# Patient Record
Sex: Female | Born: 1950 | Race: Black or African American | Hispanic: No | Marital: Married | Smoking: Never smoker
Health system: Southern US, Community
[De-identification: ages and names within clinical notes are randomized; demographics above are authoritative.]

## PROBLEM LIST (undated history)

## (undated) DIAGNOSIS — D649 Anemia, unspecified: Secondary | ICD-10-CM

## (undated) DIAGNOSIS — F419 Anxiety disorder, unspecified: Secondary | ICD-10-CM

## (undated) HISTORY — DX: Anxiety disorder, unspecified: F41.9

## (undated) HISTORY — PX: TONSILLECTOMY: SUR1361

## (undated) HISTORY — PX: TOTAL ABDOMINAL HYSTERECTOMY W/ BILATERAL SALPINGOOPHORECTOMY: SHX83

## (undated) HISTORY — DX: Anemia, unspecified: D64.9

---

## 1997-07-25 ENCOUNTER — Ambulatory Visit (HOSPITAL_COMMUNITY): Admission: RE | Admit: 1997-07-25 | Discharge: 1997-07-25 | Payer: Self-pay | Admitting: Obstetrics and Gynecology

## 1998-01-16 ENCOUNTER — Other Ambulatory Visit: Admission: RE | Admit: 1998-01-16 | Discharge: 1998-01-16 | Payer: Self-pay | Admitting: Obstetrics and Gynecology

## 1998-02-25 ENCOUNTER — Ambulatory Visit (HOSPITAL_COMMUNITY): Admission: RE | Admit: 1998-02-25 | Discharge: 1998-02-25 | Payer: Self-pay | Admitting: Obstetrics and Gynecology

## 1999-03-04 ENCOUNTER — Encounter: Payer: Self-pay | Admitting: Obstetrics and Gynecology

## 1999-03-04 ENCOUNTER — Ambulatory Visit (HOSPITAL_COMMUNITY): Admission: RE | Admit: 1999-03-04 | Discharge: 1999-03-04 | Payer: Self-pay | Admitting: Obstetrics and Gynecology

## 1999-05-12 ENCOUNTER — Other Ambulatory Visit: Admission: RE | Admit: 1999-05-12 | Discharge: 1999-05-12 | Payer: Self-pay | Admitting: *Deleted

## 1999-05-22 ENCOUNTER — Encounter: Payer: Self-pay | Admitting: Obstetrics and Gynecology

## 1999-05-22 ENCOUNTER — Encounter: Admission: RE | Admit: 1999-05-22 | Discharge: 1999-05-22 | Payer: Self-pay | Admitting: *Deleted

## 2000-04-19 ENCOUNTER — Encounter: Payer: Self-pay | Admitting: Obstetrics and Gynecology

## 2000-04-19 ENCOUNTER — Ambulatory Visit (HOSPITAL_COMMUNITY): Admission: RE | Admit: 2000-04-19 | Discharge: 2000-04-19 | Payer: Self-pay | Admitting: Obstetrics and Gynecology

## 2000-06-21 ENCOUNTER — Other Ambulatory Visit: Admission: RE | Admit: 2000-06-21 | Discharge: 2000-06-21 | Payer: Self-pay | Admitting: *Deleted

## 2001-06-23 ENCOUNTER — Encounter: Payer: Self-pay | Admitting: Obstetrics and Gynecology

## 2001-06-23 ENCOUNTER — Ambulatory Visit (HOSPITAL_COMMUNITY): Admission: RE | Admit: 2001-06-23 | Discharge: 2001-06-23 | Payer: Self-pay | Admitting: Obstetrics and Gynecology

## 2001-06-23 ENCOUNTER — Other Ambulatory Visit: Admission: RE | Admit: 2001-06-23 | Discharge: 2001-06-23 | Payer: Self-pay | Admitting: Obstetrics and Gynecology

## 2002-07-06 ENCOUNTER — Other Ambulatory Visit: Admission: RE | Admit: 2002-07-06 | Discharge: 2002-07-06 | Payer: Self-pay | Admitting: Obstetrics and Gynecology

## 2002-10-30 ENCOUNTER — Encounter: Payer: Self-pay | Admitting: Obstetrics and Gynecology

## 2002-10-30 ENCOUNTER — Ambulatory Visit (HOSPITAL_COMMUNITY): Admission: RE | Admit: 2002-10-30 | Discharge: 2002-10-30 | Payer: Self-pay | Admitting: Obstetrics and Gynecology

## 2005-10-06 ENCOUNTER — Encounter: Admission: RE | Admit: 2005-10-06 | Discharge: 2005-10-06 | Payer: Self-pay | Admitting: Obstetrics and Gynecology

## 2008-05-30 ENCOUNTER — Ambulatory Visit (HOSPITAL_COMMUNITY): Admission: RE | Admit: 2008-05-30 | Discharge: 2008-05-30 | Payer: Self-pay | Admitting: Obstetrics and Gynecology

## 2009-06-19 ENCOUNTER — Ambulatory Visit (HOSPITAL_COMMUNITY): Admission: RE | Admit: 2009-06-19 | Discharge: 2009-06-19 | Payer: Self-pay | Admitting: Obstetrics and Gynecology

## 2009-12-24 ENCOUNTER — Encounter: Admission: RE | Admit: 2009-12-24 | Discharge: 2009-12-24 | Payer: Self-pay | Admitting: Obstetrics and Gynecology

## 2010-03-13 ENCOUNTER — Encounter: Admission: RE | Admit: 2010-03-13 | Discharge: 2010-03-13 | Payer: Self-pay | Admitting: Sports Medicine

## 2010-03-30 ENCOUNTER — Encounter: Admission: RE | Admit: 2010-03-30 | Discharge: 2010-03-30 | Payer: Self-pay | Admitting: Sports Medicine

## 2010-04-23 ENCOUNTER — Ambulatory Visit (HOSPITAL_COMMUNITY)
Admission: RE | Admit: 2010-04-23 | Discharge: 2010-04-23 | Payer: Self-pay | Source: Home / Self Care | Attending: Neurosurgery | Admitting: Neurosurgery

## 2010-07-28 LAB — URINALYSIS, ROUTINE W REFLEX MICROSCOPIC
Glucose, UA: NEGATIVE mg/dL
Hgb urine dipstick: NEGATIVE
Protein, ur: NEGATIVE mg/dL
Specific Gravity, Urine: 1.023 (ref 1.005–1.030)
pH: 6 (ref 5.0–8.0)

## 2010-07-28 LAB — URINE MICROSCOPIC-ADD ON

## 2010-07-28 LAB — CBC
HCT: 37.4 % (ref 36.0–46.0)
MCH: 29.6 pg (ref 26.0–34.0)
MCHC: 33.4 g/dL (ref 30.0–36.0)
RBC: 4.22 MIL/uL (ref 3.87–5.11)
RDW: 14.3 % (ref 11.5–15.5)
WBC: 5.2 10*3/uL (ref 4.0–10.5)

## 2010-07-28 LAB — BASIC METABOLIC PANEL
Creatinine, Ser: 0.69 mg/dL (ref 0.4–1.2)
GFR calc Af Amer: >60 mL/min (ref >60)
Sodium: 140 mEq/L (ref 135–145)

## 2011-01-26 ENCOUNTER — Other Ambulatory Visit (HOSPITAL_COMMUNITY): Payer: Self-pay | Admitting: Obstetrics and Gynecology

## 2011-01-26 DIAGNOSIS — Z1231 Encounter for screening mammogram for malignant neoplasm of breast: Secondary | ICD-10-CM

## 2011-01-28 ENCOUNTER — Ambulatory Visit (HOSPITAL_COMMUNITY)
Admission: RE | Admit: 2011-01-28 | Discharge: 2011-01-28 | Disposition: A | Discharge status: Home / Self Care | Payer: Self-pay | Source: Ambulatory Visit | Attending: Obstetrics and Gynecology | Admitting: Obstetrics and Gynecology

## 2011-01-28 DIAGNOSIS — Z1231 Encounter for screening mammogram for malignant neoplasm of breast: Secondary | ICD-10-CM

## 2012-01-12 IMAGING — CR DG CHEST 2V
2 series · 2 of 2 positions shown · non-contrast
Comparison: None

CLINICAL DATA: Preop lumbar HNP

CHEST - 2 VIEW

[view not recorded (1 of 2)]
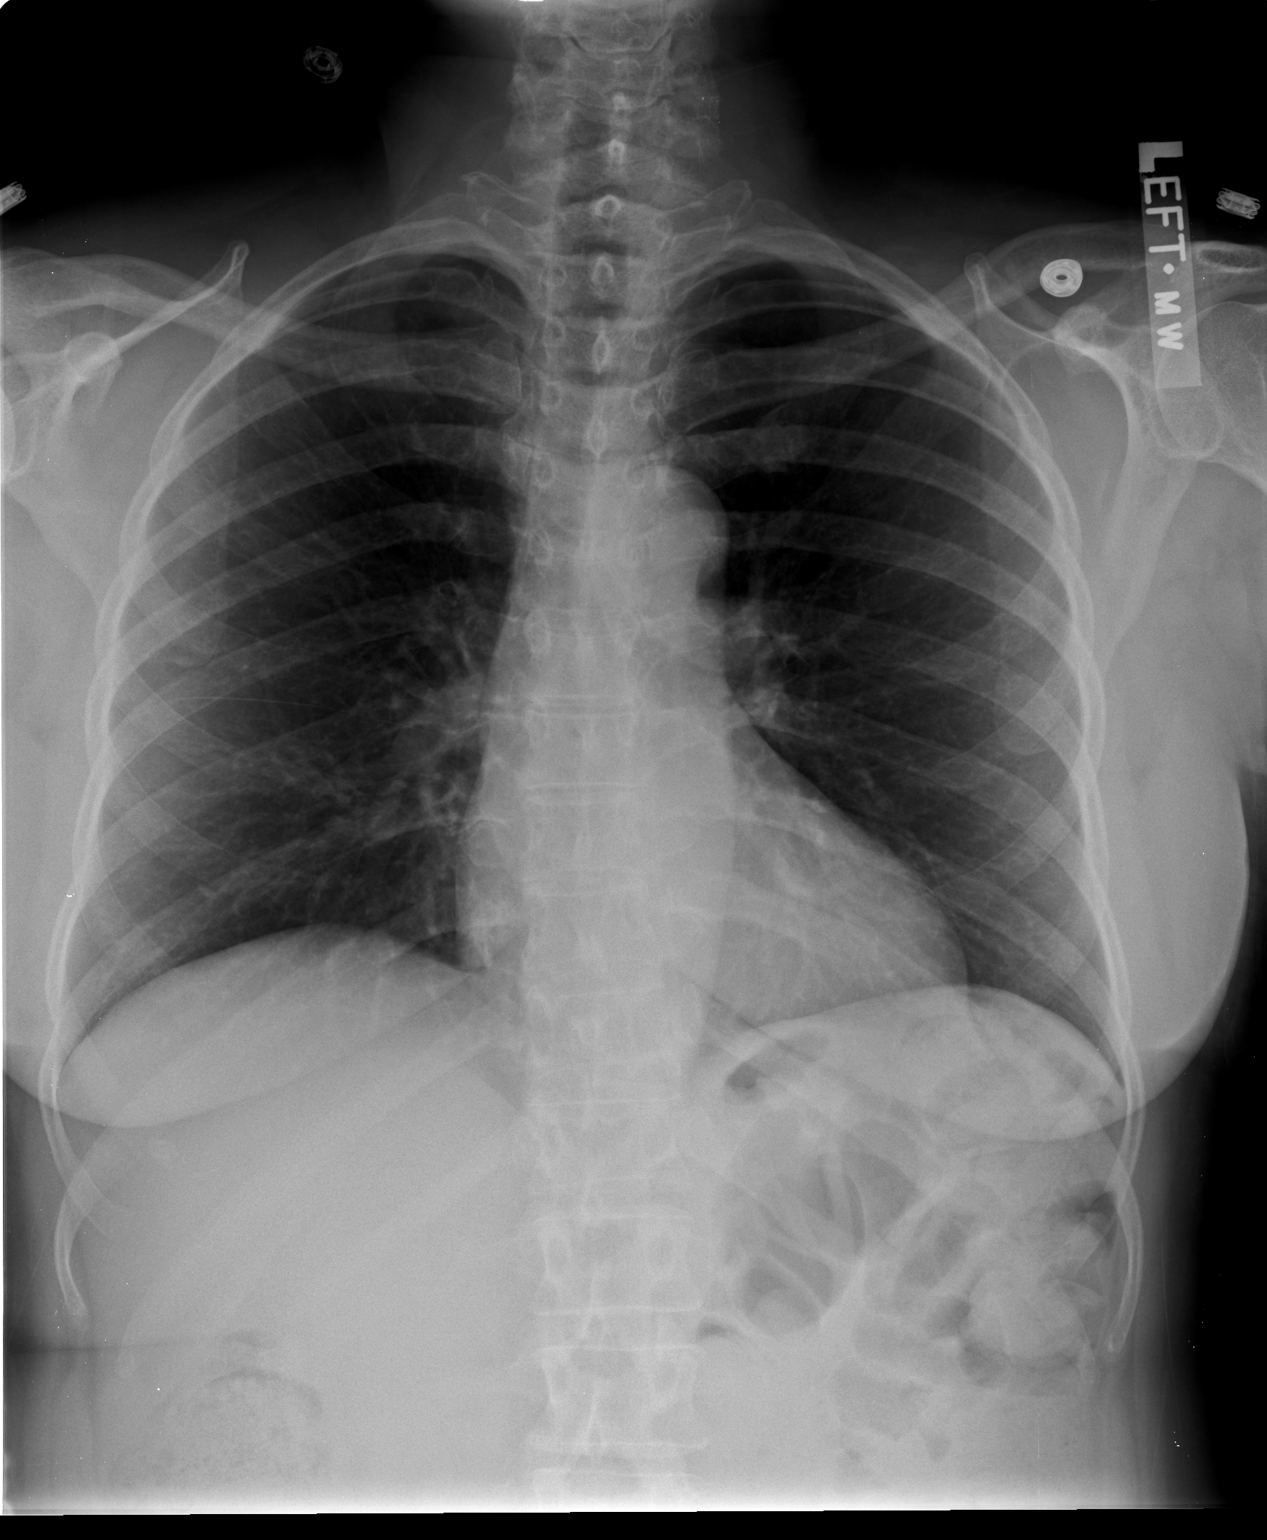

[view not recorded (2 of 2)]
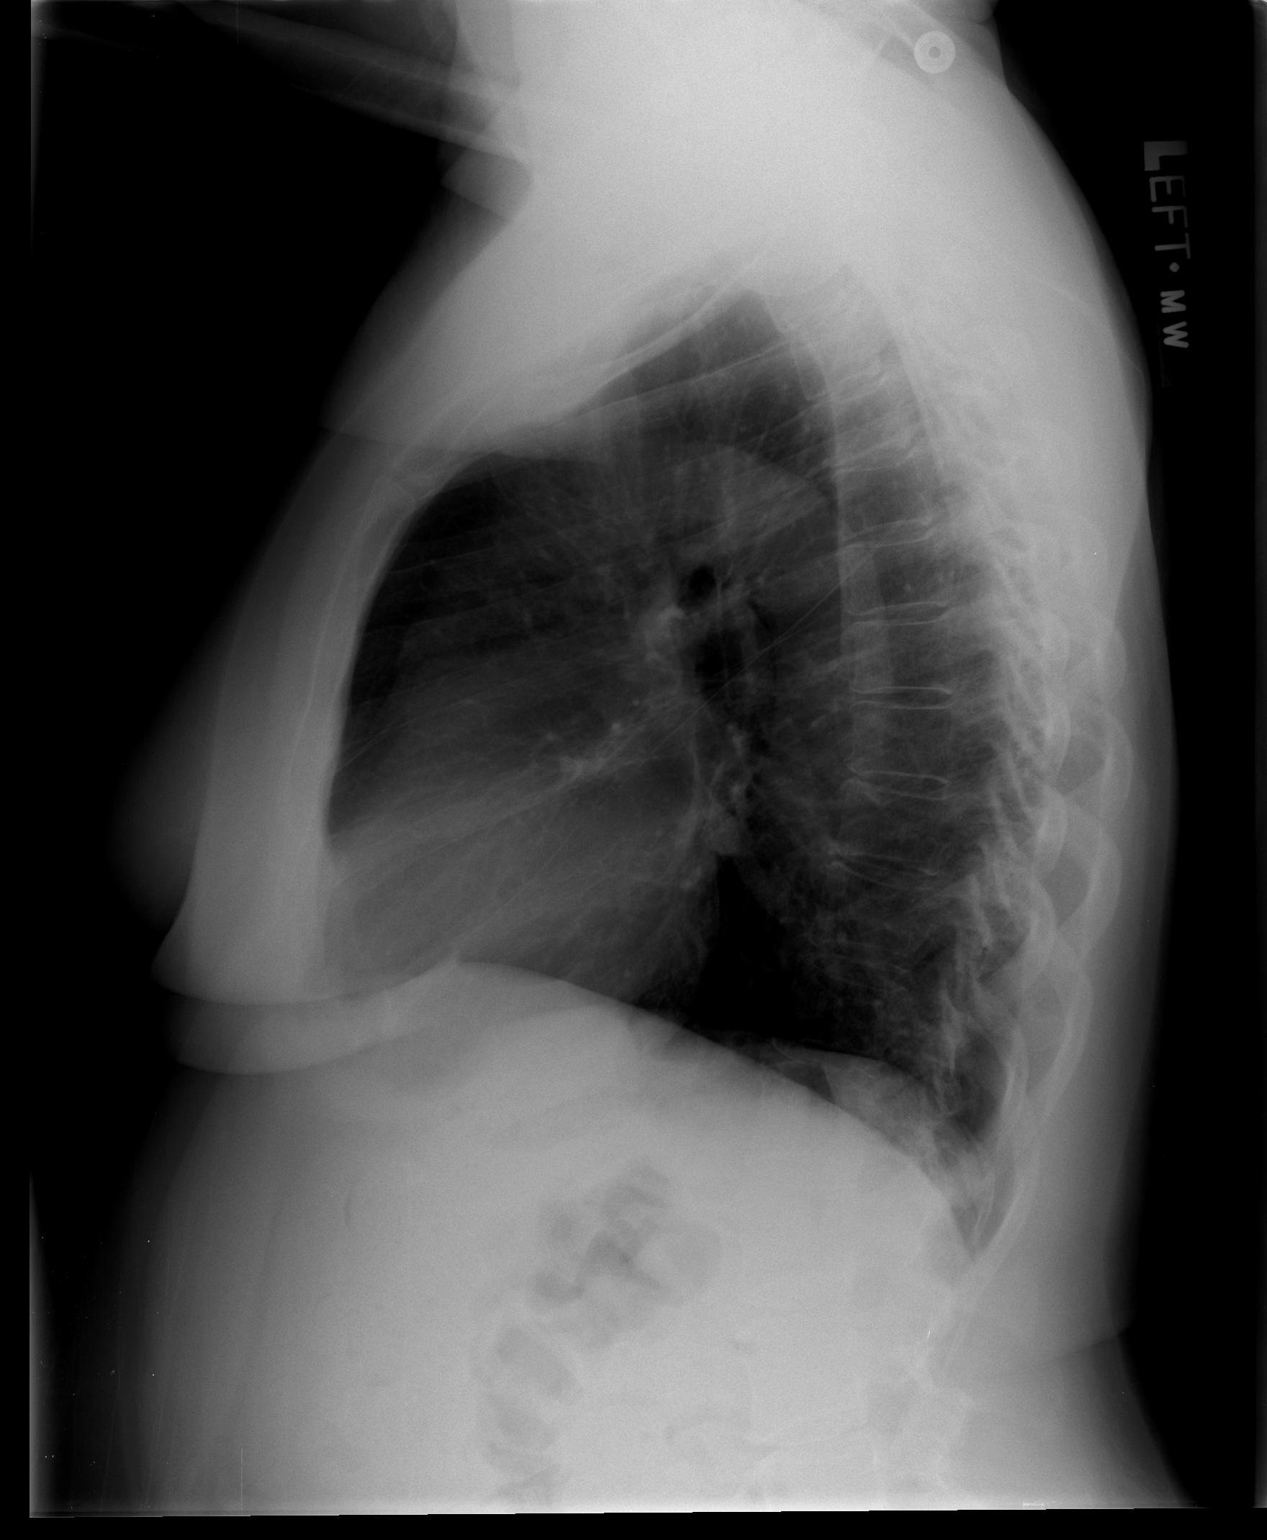

[2 of 2 positions shown; findings below may reference images not displayed]

FINDINGS: Normal heart size and  normal vascularity.  Lungs are
clear without infiltrate effusion or mass lesion.
IMPRESSION: No active cardiopulmonary disease.

## 2012-11-17 ENCOUNTER — Ambulatory Visit (INDEPENDENT_AMBULATORY_CARE_PROVIDER_SITE_OTHER): Payer: Commercial Indemnity | Admitting: Family Medicine

## 2012-11-17 VITALS — BP 140/76 | HR 45 | Temp 98.5°F | Resp 16 | Ht 67.5 in | Wt 181.0 lb

## 2012-11-17 DIAGNOSIS — S61409A Unspecified open wound of unspecified hand, initial encounter: Secondary | ICD-10-CM

## 2012-11-17 DIAGNOSIS — M79609 Pain in unspecified limb: Secondary | ICD-10-CM

## 2012-11-17 DIAGNOSIS — S61412A Laceration without foreign body of left hand, initial encounter: Secondary | ICD-10-CM

## 2012-11-17 NOTE — Progress Notes (Signed)
   Patient ID: Denise Baxter MRN: [id number], DOB: Mar 08, 1951, 62 y.o. Date of Encounter: 11/17/2012, 3:27 PM   PROCEDURE NOTE: Verbal consent obtained.  Risks and benefits of the procedure were explained. Patient made an informed decision to proceed with the procedure. Sterile technique employed. Numbing: Anesthesia obtained with 1% lidocaine with epi 2 cc for local anesthesia.   Cleansed with soap and water. Irrigated. Betadine prep per usual protocol.  Wound explored, no deep structures involved, no foreign bodies.   Wound repaired with # 4 simple interrupted sutures of 5-0 Prolene.  Hemostasis obtained. Wound cleansed and dressed.  Wound care instructions including precautions covered with patient. Handout given.  Anticipate suture removal in 10 days.   SignedEula Listen, PA-C 11/17/2012 3:27 PM

## 2012-11-17 NOTE — Progress Notes (Signed)
[facility] [address] [place] [address] [phone]  Date:  11/17/2012   Name:  Denise Baxter   DOB:  Jun 16, 1950   MRN:  [id number]  PCP:  No primary provider on file.    Chief Complaint: Laceration   History of Present Illness:  Denise Baxter is a 62 y.o. very pleasant female patient who presents with the following:  She cut her left hand this am on a package for some frozen sausage.  This occurred around 11am.   She is generally healthy. Her last tetanus shot was about 5 years ago. oherwise she is well today She is right handed  There are no active problems to display for this patient.   Past Medical History  Diagnosis Date  . Anemia   . Anxiety     Past Surgical History  Procedure Laterality Date  . Tonsillectomy    . Total abdominal hysterectomy w/ bilateral salpingoophorectomy      History  Substance Use Topics  . Smoking status: Never Smoker   . Smokeless tobacco: Not on file  . Alcohol Use: No    History reviewed. No pertinent family history.  No Known Allergies  Medication list has been reviewed and updated.  No current outpatient prescriptions on file prior to visit.   No current facility-administered medications on file prior to visit.    Review of Systems:  As per HPI- otherwise negative.'  Physical Examination: Filed Vitals:   11/17/12 1251  BP: 140/76  Pulse: 45  Temp: 98.5 F (36.9 C)  Resp: 16   Filed Vitals:   11/17/12 1251  Height: 5' 7.5" (1.715 m)  Weight: 181 lb (82.101 kg)   Body mass index is 27.91 kg/(m^2). Ideal Body Weight: Weight in (lb) to have BMI = 25: 161.7  GEN: WDWN, NAD, Non-toxic, A & O x 3, looks well HEENT: Atraumatic, Normocephalic. Neck supple. No masses, No LAD. Ears and Nose: No external deformity. CV: RRR, No M/G/R. No JVD. No thrill. No extra heart sounds. PULM: CTA B, no wheezes, crackles, rhonchi. No retractions. No resp. distress. No accessory muscle  use. ABD: S, NT, ND, +BS. No rebound. No HSM. EXTR: No c/c/e NEURO Normal gait.  PSYCH: Normally interactive. Conversant. Not depressed or anxious appearing.  Calm demeanor.  Left hand: she has a 1cm laceration over the thenar eminence.  It is fairly deep but not bleeding.  Normal strength, sensation and movement of her thumb and fingers.  No evidence of damage to deeper structures   Repaired by Eula Listen, PA-C.   See separate note  Assessment and Plan: Laceration of hand, left, initial encounter  Pain, hand, left  Laceration of left hand repaired as per Eula Listen, PA-C.    Signed Abbe Amsterdam, MD

## 2012-11-28 ENCOUNTER — Ambulatory Visit (INDEPENDENT_AMBULATORY_CARE_PROVIDER_SITE_OTHER): Payer: Commercial Indemnity | Admitting: *Deleted

## 2012-11-28 DIAGNOSIS — S61412D Laceration without foreign body of left hand, subsequent encounter: Secondary | ICD-10-CM

## 2012-11-28 DIAGNOSIS — Z5189 Encounter for other specified aftercare: Secondary | ICD-10-CM

## 2012-11-28 NOTE — Progress Notes (Signed)
Patient comes in today for removal of sutures that were placed 11/17/12. No signs of infection or irritation. #4 sutures removed from left hand. Pt tolerated well. No bleeding from laceration or suture sites. Pt did not want a bandaid placed.

## 2013-04-25 ENCOUNTER — Other Ambulatory Visit (HOSPITAL_COMMUNITY): Payer: Self-pay | Admitting: Obstetrics & Gynecology

## 2013-04-25 DIAGNOSIS — Z1231 Encounter for screening mammogram for malignant neoplasm of breast: Secondary | ICD-10-CM

## 2013-05-16 ENCOUNTER — Ambulatory Visit (HOSPITAL_COMMUNITY): Payer: Self-pay

## 2013-05-23 ENCOUNTER — Ambulatory Visit (HOSPITAL_COMMUNITY)
Admission: RE | Admit: 2013-05-23 | Discharge: 2013-05-23 | Disposition: A | Discharge status: Home / Self Care | Payer: BC Managed Care – PPO | Source: Ambulatory Visit | Attending: Obstetrics & Gynecology | Admitting: Obstetrics & Gynecology

## 2013-05-23 DIAGNOSIS — Z1231 Encounter for screening mammogram for malignant neoplasm of breast: Secondary | ICD-10-CM

## 2016-10-08 DIAGNOSIS — Z0000 Encounter for general adult medical examination without abnormal findings: Secondary | ICD-10-CM | POA: Diagnosis not present

## 2016-10-08 DIAGNOSIS — E119 Type 2 diabetes mellitus without complications: Secondary | ICD-10-CM | POA: Diagnosis not present

## 2016-10-13 DIAGNOSIS — E119 Type 2 diabetes mellitus without complications: Secondary | ICD-10-CM | POA: Diagnosis not present

## 2016-10-13 DIAGNOSIS — R319 Hematuria, unspecified: Secondary | ICD-10-CM | POA: Diagnosis not present

## 2016-10-13 DIAGNOSIS — I10 Essential (primary) hypertension: Secondary | ICD-10-CM | POA: Diagnosis not present

## 2016-10-13 DIAGNOSIS — D649 Anemia, unspecified: Secondary | ICD-10-CM | POA: Diagnosis not present

## 2016-10-29 DIAGNOSIS — H25013 Cortical age-related cataract, bilateral: Secondary | ICD-10-CM | POA: Diagnosis not present

## 2016-10-29 DIAGNOSIS — H5203 Hypermetropia, bilateral: Secondary | ICD-10-CM | POA: Diagnosis not present

## 2016-10-29 DIAGNOSIS — H2513 Age-related nuclear cataract, bilateral: Secondary | ICD-10-CM | POA: Diagnosis not present

## 2016-10-29 DIAGNOSIS — E119 Type 2 diabetes mellitus without complications: Secondary | ICD-10-CM | POA: Diagnosis not present

## 2017-02-23 DIAGNOSIS — H15102 Unspecified episcleritis, left eye: Secondary | ICD-10-CM | POA: Diagnosis not present

## 2017-03-03 DIAGNOSIS — J40 Bronchitis, not specified as acute or chronic: Secondary | ICD-10-CM | POA: Diagnosis not present

## 2017-03-08 DIAGNOSIS — H15102 Unspecified episcleritis, left eye: Secondary | ICD-10-CM | POA: Diagnosis not present

## 2017-03-30 DIAGNOSIS — Z23 Encounter for immunization: Secondary | ICD-10-CM | POA: Diagnosis not present

## 2017-04-29 DIAGNOSIS — E119 Type 2 diabetes mellitus without complications: Secondary | ICD-10-CM | POA: Diagnosis not present

## 2017-04-29 DIAGNOSIS — I10 Essential (primary) hypertension: Secondary | ICD-10-CM | POA: Diagnosis not present

## 2017-05-02 DIAGNOSIS — E119 Type 2 diabetes mellitus without complications: Secondary | ICD-10-CM | POA: Diagnosis not present

## 2017-05-02 DIAGNOSIS — I10 Essential (primary) hypertension: Secondary | ICD-10-CM | POA: Diagnosis not present

## 2017-10-28 DIAGNOSIS — E119 Type 2 diabetes mellitus without complications: Secondary | ICD-10-CM | POA: Diagnosis not present

## 2017-10-28 DIAGNOSIS — H524 Presbyopia: Secondary | ICD-10-CM | POA: Diagnosis not present

## 2017-10-28 DIAGNOSIS — H43812 Vitreous degeneration, left eye: Secondary | ICD-10-CM | POA: Diagnosis not present

## 2017-10-28 DIAGNOSIS — H25013 Cortical age-related cataract, bilateral: Secondary | ICD-10-CM | POA: Diagnosis not present

## 2017-11-21 DIAGNOSIS — I10 Essential (primary) hypertension: Secondary | ICD-10-CM | POA: Diagnosis not present

## 2017-11-21 DIAGNOSIS — E119 Type 2 diabetes mellitus without complications: Secondary | ICD-10-CM | POA: Diagnosis not present

## 2018-05-22 DIAGNOSIS — I10 Essential (primary) hypertension: Secondary | ICD-10-CM | POA: Diagnosis not present

## 2018-05-22 DIAGNOSIS — E119 Type 2 diabetes mellitus without complications: Secondary | ICD-10-CM | POA: Diagnosis not present

## 2018-07-24 DIAGNOSIS — Z9189 Other specified personal risk factors, not elsewhere classified: Secondary | ICD-10-CM | POA: Diagnosis not present

## 2018-07-24 DIAGNOSIS — I10 Essential (primary) hypertension: Secondary | ICD-10-CM | POA: Diagnosis not present

## 2018-07-25 DIAGNOSIS — I10 Essential (primary) hypertension: Secondary | ICD-10-CM | POA: Diagnosis not present

## 2018-07-25 DIAGNOSIS — E119 Type 2 diabetes mellitus without complications: Secondary | ICD-10-CM | POA: Diagnosis not present

## 2018-07-25 DIAGNOSIS — I739 Peripheral vascular disease, unspecified: Secondary | ICD-10-CM | POA: Diagnosis not present

## 2018-07-25 DIAGNOSIS — Z9189 Other specified personal risk factors, not elsewhere classified: Secondary | ICD-10-CM | POA: Diagnosis not present

## 2018-10-19 DIAGNOSIS — E119 Type 2 diabetes mellitus without complications: Secondary | ICD-10-CM | POA: Diagnosis not present

## 2018-10-20 DIAGNOSIS — I10 Essential (primary) hypertension: Secondary | ICD-10-CM | POA: Diagnosis not present

## 2018-10-20 DIAGNOSIS — E119 Type 2 diabetes mellitus without complications: Secondary | ICD-10-CM | POA: Diagnosis not present

## 2018-10-20 DIAGNOSIS — I739 Peripheral vascular disease, unspecified: Secondary | ICD-10-CM | POA: Diagnosis not present

## 2018-10-20 DIAGNOSIS — Z9189 Other specified personal risk factors, not elsewhere classified: Secondary | ICD-10-CM | POA: Diagnosis not present

## 2019-03-29 DIAGNOSIS — R42 Dizziness and giddiness: Secondary | ICD-10-CM | POA: Diagnosis not present

## 2019-03-29 DIAGNOSIS — H9313 Tinnitus, bilateral: Secondary | ICD-10-CM | POA: Diagnosis not present

## 2019-03-29 DIAGNOSIS — H903 Sensorineural hearing loss, bilateral: Secondary | ICD-10-CM | POA: Diagnosis not present

## 2019-03-29 DIAGNOSIS — H6123 Impacted cerumen, bilateral: Secondary | ICD-10-CM | POA: Diagnosis not present

## 2019-03-29 DIAGNOSIS — H8111 Benign paroxysmal vertigo, right ear: Secondary | ICD-10-CM | POA: Diagnosis not present

## 2019-04-20 DIAGNOSIS — E119 Type 2 diabetes mellitus without complications: Secondary | ICD-10-CM | POA: Diagnosis not present

## 2019-04-20 DIAGNOSIS — Z0000 Encounter for general adult medical examination without abnormal findings: Secondary | ICD-10-CM | POA: Diagnosis not present

## 2019-04-23 DIAGNOSIS — I739 Peripheral vascular disease, unspecified: Secondary | ICD-10-CM | POA: Diagnosis not present

## 2019-04-23 DIAGNOSIS — D649 Anemia, unspecified: Secondary | ICD-10-CM | POA: Diagnosis not present

## 2019-04-23 DIAGNOSIS — E119 Type 2 diabetes mellitus without complications: Secondary | ICD-10-CM | POA: Diagnosis not present

## 2019-04-23 DIAGNOSIS — I10 Essential (primary) hypertension: Secondary | ICD-10-CM | POA: Diagnosis not present

## 2019-04-24 DIAGNOSIS — Z1159 Encounter for screening for other viral diseases: Secondary | ICD-10-CM | POA: Diagnosis not present

## 2019-04-24 DIAGNOSIS — D649 Anemia, unspecified: Secondary | ICD-10-CM | POA: Diagnosis not present

## 2019-04-24 DIAGNOSIS — M791 Myalgia, unspecified site: Secondary | ICD-10-CM | POA: Diagnosis not present

## 2019-05-09 DIAGNOSIS — M791 Myalgia, unspecified site: Secondary | ICD-10-CM | POA: Diagnosis not present

## 2019-06-10 ENCOUNTER — Ambulatory Visit: Payer: Self-pay

## 2020-09-19 ENCOUNTER — Ambulatory Visit: Payer: 59 | Admitting: Podiatry

## 2020-09-19 ENCOUNTER — Other Ambulatory Visit: Payer: Self-pay

## 2020-09-19 ENCOUNTER — Encounter: Payer: Self-pay | Admitting: Podiatry

## 2020-09-19 DIAGNOSIS — M2042 Other hammer toe(s) (acquired), left foot: Secondary | ICD-10-CM

## 2020-09-19 DIAGNOSIS — L84 Corns and callosities: Secondary | ICD-10-CM

## 2020-09-19 DIAGNOSIS — M2041 Other hammer toe(s) (acquired), right foot: Secondary | ICD-10-CM | POA: Diagnosis not present

## 2020-09-19 DIAGNOSIS — L600 Ingrowing nail: Secondary | ICD-10-CM

## 2020-09-19 NOTE — Progress Notes (Signed)
Subjective:   Patient ID: Denise Baxter, female   DOB: 70 y.o.   MRN: [id number]   HPI Patient presents stating she has had chronic ingrown toenails of the big toes both feet that are sore and make it hard to wear shoe gear.  States that she is tried to trim and soak them without relief and they gradually become worse over time.  Patient does not smoke likes to be active   Review of Systems  All other systems reviewed and are negative.       Objective:  Physical Exam Vitals and nursing note reviewed.  Constitutional:      Appearance: She is well-developed.  Pulmonary:     Effort: Pulmonary effort is normal.  Musculoskeletal:        General: Normal range of motion.  Skin:    General: Skin is warm.  Neurological:     Mental Status: She is alert.     Neurovascular status intact muscle strength found to be adequate range of motion adequate.  Patient is noted to have incurvated medial and lateral borders of the hallux bilateral that are painful when pressed and make shoe gear difficult.  They are both sore and they are gradually becoming more of an issue for her and also she complains about digital deformities bilateral with concerns about elevation of her toe and chronic keratotic lesion plantar aspect both feet     Assessment:  Ingrown toenail deformity hallux bilateral medial lateral borders along with moderate hammertoe deformity with keratotic lesions nonsymptomatic and keratotic lesion subsecond metatarsal painful     Plan:  H&P reviewed all conditions.  At this point do not recommend treatment for digits and lesions except for using over-the-counter devices wider shoes mesh materials.  For the nails I recommended correction allowed her to read consent form reviewed correction and explained all possible complications with patient.  I infiltrated each hallux 60 mg like Marcaine mixture sterile prep done and using sterile instrumentation remove the medial lateral borders  exposed matrix applied phenol 3 applications 30 seconds to each border followed by alcohol lavage sterile dressing gave instructions on soaks encouraged her to leave dressings on 24 hours take them off earlier if any throbbing were to occur and patient is encouraged to call with questions concerns which may arise

## 2020-09-19 NOTE — Patient Instructions (Signed)

## 2020-10-06 ENCOUNTER — Telehealth: Payer: Self-pay | Admitting: *Deleted

## 2020-10-06 NOTE — Telephone Encounter (Signed)
Returned call to patient and left vmessage of instructions given by Dr Charlsie Merles.

## 2020-10-06 NOTE — Telephone Encounter (Signed)
Patient is calling and would like Dr Charlsie Merles know that her right  percedural great toe is still draining and some painful. She is wearing regular shoes and is continuing to soak in epsom salts.Please advise.

## 2020-10-06 NOTE — Telephone Encounter (Signed)
Continue to soak and let it air dry while at home. As long as not getting worse should gradually get better

## 2021-03-05 ENCOUNTER — Encounter: Payer: Self-pay | Admitting: Podiatry

## 2021-03-05 ENCOUNTER — Other Ambulatory Visit: Payer: Self-pay

## 2021-03-05 ENCOUNTER — Ambulatory Visit: Payer: 59 | Admitting: Podiatry

## 2021-03-05 DIAGNOSIS — L600 Ingrowing nail: Secondary | ICD-10-CM

## 2021-03-05 DIAGNOSIS — M2042 Other hammer toe(s) (acquired), left foot: Secondary | ICD-10-CM

## 2021-03-05 DIAGNOSIS — M2041 Other hammer toe(s) (acquired), right foot: Secondary | ICD-10-CM

## 2021-03-05 NOTE — Progress Notes (Signed)
Subjective:   Patient ID: Denise Baxter, female   DOB: 70 y.o.   MRN: [id number]   HPI Patient presents with a damaged right big toenail stating that the ingrown's are doing better but it is across the top of the nailbed and also digital deformity second digit right over left with concerns about lesion formation neuro   ROS      Objective:  Physical Exam  Vascular status intact with patient found to have an elevated second digit right with keratotic tissue formation and has a abnormal growth plate of the right hallux distal one third of the nailbed occurring across the entire top with irritation and pain with pressure     Assessment:  Hammertoe deformity along with nail disease second right with ingrown component     Plan:  H&P both conditions reviewed and for digit I did discuss digital fusion and at this point get a hold off use cushioning and wider shoe gear.  I explained that what surgery would entail and recovery and today we discussed the nail I anesthetized 60 mg like Marcaine mixture using sterile instrumentation I remove the distal one half of the nailbed flushed the area applied sterile dressing will allow it to regrow and ultimately may require permanent procedure in future

## 2021-03-05 NOTE — Patient Instructions (Signed)
